# Patient Record
Sex: Male | Born: 1935 | Race: White | Hispanic: No | Marital: Single
Health system: Southern US, Community
[De-identification: ages and names within clinical notes are randomized; demographics above are authoritative.]

---

## 2004-10-04 ENCOUNTER — Ambulatory Visit: Payer: Self-pay | Admitting: Nephrology

## 2004-12-15 ENCOUNTER — Ambulatory Visit: Payer: Self-pay | Admitting: Internal Medicine

## 2006-09-22 ENCOUNTER — Other Ambulatory Visit: Payer: Self-pay

## 2006-09-22 ENCOUNTER — Emergency Department: Payer: Self-pay | Admitting: Emergency Medicine

## 2009-08-19 ENCOUNTER — Ambulatory Visit: Payer: Self-pay | Admitting: Unknown Physician Specialty

## 2010-06-16 ENCOUNTER — Ambulatory Visit: Payer: Self-pay | Admitting: Urology

## 2012-06-16 ENCOUNTER — Inpatient Hospital Stay: Payer: Self-pay | Admitting: Internal Medicine

## 2012-06-16 LAB — CBC
HCT: 33.1 % — ABNORMAL LOW
HGB: 10.4 g/dL — ABNORMAL LOW
MCH: 29.5 pg
MCHC: 31.6 g/dL — ABNORMAL LOW
MCV: 94 fL
Platelet: 251 x10 3/mm 3
RBC: 3.54 x10 6/mm 3 — ABNORMAL LOW
RDW: 14.1 %
WBC: 8.1 x10 3/mm 3

## 2012-06-16 LAB — PROTIME-INR: INR: 1.1

## 2012-06-16 LAB — BASIC METABOLIC PANEL WITH GFR
Anion Gap: 8
BUN: 22 mg/dL — ABNORMAL HIGH
Calcium, Total: 8.2 mg/dL — ABNORMAL LOW
Chloride: 104 mmol/L
Co2: 26 mmol/L
Creatinine: 1.49 mg/dL — ABNORMAL HIGH
EGFR (African American): 50 — ABNORMAL LOW
EGFR (Non-African Amer.): 43 — ABNORMAL LOW
Glucose: 138 mg/dL — ABNORMAL HIGH
Osmolality: 281
Potassium: 4.1 mmol/L
Sodium: 138 mmol/L

## 2012-06-16 LAB — HEPATIC FUNCTION PANEL A (ARMC)
Albumin: 2.5 g/dL — ABNORMAL LOW
Alkaline Phosphatase: 81 U/L
Bilirubin, Direct: 0.1 mg/dL
Bilirubin,Total: 0.3 mg/dL
SGOT(AST): 44 U/L — ABNORMAL HIGH
SGPT (ALT): 21 U/L
Total Protein: 6.1 g/dL — ABNORMAL LOW

## 2012-06-16 LAB — PRO B NATRIURETIC PEPTIDE: B-Type Natriuretic Peptide: 506 pg/mL — ABNORMAL HIGH (ref 0–450)

## 2012-06-16 LAB — CK TOTAL AND CKMB (NOT AT ARMC)
CK, Total: 510 U/L — ABNORMAL HIGH
CK-MB: 1.9 ng/mL

## 2012-06-16 LAB — TROPONIN I: Troponin-I: 0.06 ng/mL — ABNORMAL HIGH

## 2012-06-17 DIAGNOSIS — I369 Nonrheumatic tricuspid valve disorder, unspecified: Secondary | ICD-10-CM

## 2012-06-17 LAB — MAGNESIUM: Magnesium: 1.9 mg/dL

## 2012-06-17 LAB — CBC WITH DIFFERENTIAL/PLATELET
Basophil #: 0 10*3/uL (ref 0.0–0.1)
Basophil %: 0.4 %
Eosinophil %: 4 %
HGB: 11.4 g/dL — ABNORMAL LOW (ref 13.0–18.0)
Lymphocyte #: 1 10*3/uL (ref 1.0–3.6)
MCH: 30.4 pg (ref 26.0–34.0)
MCHC: 32.8 g/dL (ref 32.0–36.0)
Monocyte #: 0.5 x10 3/mm (ref 0.2–1.0)
Monocyte %: 6.3 %
Neutrophil #: 6.4 10*3/uL (ref 1.4–6.5)
Neutrophil %: 76.7 %
RDW: 14 % (ref 11.5–14.5)
WBC: 8.3 10*3/uL (ref 3.8–10.6)

## 2012-06-17 LAB — URINALYSIS, COMPLETE
Bacteria: NONE SEEN
Bilirubin,UR: NEGATIVE
Glucose,UR: NEGATIVE mg/dL (ref 0–75)
Ketone: NEGATIVE
Leukocyte Esterase: NEGATIVE
Nitrite: NEGATIVE
Ph: 5 (ref 4.5–8.0)
Protein: NEGATIVE
Specific Gravity: 1.005 (ref 1.003–1.030)
WBC UR: 1 /HPF (ref 0–5)

## 2012-06-17 LAB — COMPREHENSIVE METABOLIC PANEL
Albumin: 2.6 g/dL — ABNORMAL LOW (ref 3.4–5.0)
Alkaline Phosphatase: 87 U/L (ref 50–136)
Calcium, Total: 8 mg/dL — ABNORMAL LOW (ref 8.5–10.1)
Chloride: 105 mmol/L (ref 98–107)
Co2: 29 mmol/L (ref 21–32)
Creatinine: 1.47 mg/dL — ABNORMAL HIGH (ref 0.60–1.30)
EGFR (African American): 51 — ABNORMAL LOW
EGFR (Non-African Amer.): 44 — ABNORMAL LOW
Glucose: 106 mg/dL — ABNORMAL HIGH (ref 65–99)
Osmolality: 282 (ref 275–301)
Potassium: 3.7 mmol/L (ref 3.5–5.1)
SGOT(AST): 43 U/L — ABNORMAL HIGH (ref 15–37)
SGPT (ALT): 20 U/L (ref 12–78)
Sodium: 140 mmol/L (ref 136–145)

## 2012-06-17 LAB — LIPID PANEL
HDL Cholesterol: 44 mg/dL (ref 40–60)
Ldl Cholesterol, Calc: 78 mg/dL (ref 0–100)
Triglycerides: 122 mg/dL (ref 0–200)
VLDL Cholesterol, Calc: 24 mg/dL (ref 5–40)

## 2012-06-17 LAB — PROTIME-INR
INR: 1.2
Prothrombin Time: 16 secs — ABNORMAL HIGH (ref 11.5–14.7)

## 2012-06-17 LAB — CK TOTAL AND CKMB (NOT AT ARMC)
CK-MB: 2 ng/mL (ref 0.5–3.6)
CK-MB: 1.3 ng/mL (ref 0.5–3.6)

## 2012-06-17 LAB — PRO B NATRIURETIC PEPTIDE: B-Type Natriuretic Peptide: 669 pg/mL — ABNORMAL HIGH (ref 0–450)

## 2012-06-18 LAB — BASIC METABOLIC PANEL
BUN: 19 mg/dL — ABNORMAL HIGH (ref 7–18)
Calcium, Total: 8.1 mg/dL — ABNORMAL LOW (ref 8.5–10.1)
Co2: 31 mmol/L (ref 21–32)
Creatinine: 1.55 mg/dL — ABNORMAL HIGH (ref 0.60–1.30)
EGFR (African American): 48 — ABNORMAL LOW
Glucose: 107 mg/dL — ABNORMAL HIGH (ref 65–99)
Potassium: 3.9 mmol/L (ref 3.5–5.1)
Sodium: 138 mmol/L (ref 136–145)

## 2012-06-19 LAB — BASIC METABOLIC PANEL
Anion Gap: 6 — ABNORMAL LOW (ref 7–16)
BUN: 21 mg/dL — ABNORMAL HIGH (ref 7–18)
Calcium, Total: 8.1 mg/dL — ABNORMAL LOW (ref 8.5–10.1)
Chloride: 98 mmol/L (ref 98–107)
Co2: 31 mmol/L (ref 21–32)
EGFR (African American): 44 — ABNORMAL LOW
Sodium: 135 mmol/L — ABNORMAL LOW (ref 136–145)

## 2012-06-20 LAB — BASIC METABOLIC PANEL
Anion Gap: 6 — ABNORMAL LOW (ref 7–16)
Chloride: 101 mmol/L (ref 98–107)
Co2: 29 mmol/L (ref 21–32)
EGFR (African American): 49 — ABNORMAL LOW

## 2012-06-21 LAB — BASIC METABOLIC PANEL
Anion Gap: 8 (ref 7–16)
BUN: 21 mg/dL — ABNORMAL HIGH (ref 7–18)
Calcium, Total: 8.3 mg/dL — ABNORMAL LOW (ref 8.5–10.1)
Creatinine: 1.45 mg/dL — ABNORMAL HIGH (ref 0.60–1.30)
EGFR (African American): 52 — ABNORMAL LOW
EGFR (Non-African Amer.): 45 — ABNORMAL LOW
Osmolality: 272 (ref 275–301)
Potassium: 4.4 mmol/L (ref 3.5–5.1)

## 2012-06-22 LAB — CULTURE, BLOOD (SINGLE)

## 2012-06-22 LAB — HEMOGLOBIN: HGB: 10.2 g/dL — ABNORMAL LOW (ref 13.0–18.0)

## 2012-06-23 LAB — BASIC METABOLIC PANEL
BUN: 26 mg/dL — ABNORMAL HIGH (ref 7–18)
Calcium, Total: 8.6 mg/dL (ref 8.5–10.1)
Chloride: 102 mmol/L (ref 98–107)
Co2: 28 mmol/L (ref 21–32)
EGFR (African American): 53 — ABNORMAL LOW
Glucose: 113 mg/dL — ABNORMAL HIGH (ref 65–99)
Osmolality: 279 (ref 275–301)
Potassium: 4 mmol/L (ref 3.5–5.1)

## 2013-08-21 ENCOUNTER — Other Ambulatory Visit: Payer: Self-pay | Admitting: Family Medicine

## 2013-08-21 LAB — URINALYSIS, COMPLETE
Bilirubin,UR: NEGATIVE
Blood: NEGATIVE
GLUCOSE, UR: NEGATIVE mg/dL (ref 0–75)
Ketone: NEGATIVE
NITRITE: POSITIVE
PH: 6 (ref 4.5–8.0)
Protein: NEGATIVE
RBC,UR: 3 /HPF (ref 0–5)
Specific Gravity: 1.019 (ref 1.003–1.030)
Squamous Epithelial: <1
WBC UR: 249 /HPF (ref 0–5)

## 2013-08-26 LAB — URINE CULTURE

## 2013-10-01 ENCOUNTER — Inpatient Hospital Stay: Payer: Self-pay | Admitting: Family Medicine

## 2013-10-01 LAB — COMPREHENSIVE METABOLIC PANEL
ALBUMIN: 3.7 g/dL (ref 3.4–5.0)
ANION GAP: 6 — AB (ref 7–16)
Alkaline Phosphatase: 68 U/L
BUN: 25 mg/dL — AB (ref 7–18)
Bilirubin,Total: 0.5 mg/dL (ref 0.2–1.0)
Calcium, Total: 8.9 mg/dL (ref 8.5–10.1)
Chloride: 107 mmol/L (ref 98–107)
Co2: 28 mmol/L (ref 21–32)
Creatinine: 1.62 mg/dL — ABNORMAL HIGH (ref 0.60–1.30)
GFR CALC AF AMER: 45 — AB
GFR CALC NON AF AMER: 39 — AB
Glucose: 110 mg/dL — ABNORMAL HIGH (ref 65–99)
OSMOLALITY: 286 (ref 275–301)
Potassium: 3.8 mmol/L (ref 3.5–5.1)
SGOT(AST): 29 U/L (ref 15–37)
SGPT (ALT): 17 U/L (ref 12–78)
SODIUM: 141 mmol/L (ref 136–145)
Total Protein: 6.4 g/dL (ref 6.4–8.2)

## 2013-10-01 LAB — PROTIME-INR
INR: 1.9
INR: 1.3
PROTHROMBIN TIME: 21.3 s — AB (ref 11.5–14.7)
Prothrombin Time: 16.3 secs — ABNORMAL HIGH (ref 11.5–14.7)

## 2013-10-01 LAB — CBC WITH DIFFERENTIAL/PLATELET
Basophil #: 0.3 10*3/uL — ABNORMAL HIGH (ref 0.0–0.1)
Basophil %: 2 %
EOS PCT: 1.6 %
Eosinophil #: 0.2 10*3/uL (ref 0.0–0.7)
HCT: 43.3 % (ref 40.0–52.0)
HGB: 14.1 g/dL (ref 13.0–18.0)
LYMPHS ABS: 0.9 10*3/uL — AB (ref 1.0–3.6)
Lymphocyte %: 6.6 %
MCH: 31.5 pg (ref 26.0–34.0)
MCHC: 32.7 g/dL (ref 32.0–36.0)
MCV: 96 fL (ref 80–100)
Monocyte #: 0.6 x10 3/mm (ref 0.2–1.0)
Monocyte %: 4.4 %
NEUTROS ABS: 11.9 10*3/uL — AB (ref 1.4–6.5)
Neutrophil %: 85.4 %
Platelet: 157 10*3/uL (ref 150–440)
RBC: 4.5 10*6/uL (ref 4.40–5.90)
RDW: 14.3 % (ref 11.5–14.5)
WBC: 14 10*3/uL — AB (ref 3.8–10.6)

## 2013-10-01 LAB — URINALYSIS, COMPLETE
Bacteria: NONE SEEN
Bilirubin,UR: NEGATIVE
Glucose,UR: NEGATIVE mg/dL (ref 0–75)
Ketone: NEGATIVE
Nitrite: POSITIVE
Ph: 7 (ref 4.5–8.0)
Protein: NEGATIVE
RBC,UR: 340 /HPF (ref 0–5)
Specific Gravity: 1.012 (ref 1.003–1.030)
Squamous Epithelial: NONE SEEN
WBC UR: 63 /HPF (ref 0–5)

## 2013-10-01 LAB — APTT: ACTIVATED PTT: 32.2 s (ref 23.6–35.9)

## 2013-10-02 LAB — BASIC METABOLIC PANEL
ANION GAP: 7 (ref 7–16)
BUN: 20 mg/dL — AB (ref 7–18)
CO2: 28 mmol/L (ref 21–32)
CREATININE: 1.4 mg/dL — AB (ref 0.60–1.30)
Calcium, Total: 8.3 mg/dL — ABNORMAL LOW (ref 8.5–10.1)
Chloride: 105 mmol/L (ref 98–107)
EGFR (African American): 54 — ABNORMAL LOW
EGFR (Non-African Amer.): 46 — ABNORMAL LOW
Glucose: 112 mg/dL — ABNORMAL HIGH (ref 65–99)
OSMOLALITY: 283 (ref 275–301)
Potassium: 4.2 mmol/L (ref 3.5–5.1)
SODIUM: 140 mmol/L (ref 136–145)

## 2013-10-02 LAB — CBC WITH DIFFERENTIAL/PLATELET
BASOS ABS: 0 10*3/uL (ref 0.0–0.1)
BASOS PCT: 0.2 %
Eosinophil #: 0.2 10*3/uL (ref 0.0–0.7)
Eosinophil %: 2.2 %
HCT: 34.5 % — AB (ref 40.0–52.0)
HGB: 11.3 g/dL — ABNORMAL LOW (ref 13.0–18.0)
LYMPHS PCT: 12.2 %
Lymphocyte #: 1 10*3/uL (ref 1.0–3.6)
MCH: 31.4 pg (ref 26.0–34.0)
MCHC: 32.6 g/dL (ref 32.0–36.0)
MCV: 96 fL (ref 80–100)
Monocyte #: 0.7 x10 3/mm (ref 0.2–1.0)
Monocyte %: 8.7 %
NEUTROS ABS: 6.5 10*3/uL (ref 1.4–6.5)
Neutrophil %: 76.7 %
Platelet: 123 10*3/uL — ABNORMAL LOW (ref 150–440)
RBC: 3.58 10*6/uL — AB (ref 4.40–5.90)
RDW: 13.7 % (ref 11.5–14.5)
WBC: 8.5 10*3/uL (ref 3.8–10.6)

## 2013-10-03 LAB — BASIC METABOLIC PANEL
Anion Gap: 9 (ref 7–16)
BUN: 18 mg/dL (ref 7–18)
Calcium, Total: 7.7 mg/dL — ABNORMAL LOW (ref 8.5–10.1)
Chloride: 104 mmol/L (ref 98–107)
Co2: 25 mmol/L (ref 21–32)
Creatinine: 1.47 mg/dL — ABNORMAL HIGH (ref 0.60–1.30)
EGFR (African American): 51 — ABNORMAL LOW
GFR CALC NON AF AMER: 44 — AB
Glucose: 104 mg/dL — ABNORMAL HIGH (ref 65–99)
Osmolality: 278 (ref 275–301)
Potassium: 3.9 mmol/L (ref 3.5–5.1)
SODIUM: 138 mmol/L (ref 136–145)

## 2013-10-03 LAB — HEMOGLOBIN: HGB: 10 g/dL — ABNORMAL LOW (ref 13.0–18.0)

## 2013-10-04 LAB — URINE CULTURE

## 2013-12-07 ENCOUNTER — Ambulatory Visit: Payer: Self-pay | Admitting: Orthopedic Surgery

## 2013-12-07 LAB — BASIC METABOLIC PANEL
Anion Gap: 3 — ABNORMAL LOW (ref 7–16)
BUN: 30 mg/dL — AB (ref 7–18)
CHLORIDE: 104 mmol/L (ref 98–107)
CO2: 30 mmol/L (ref 21–32)
CREATININE: 1.6 mg/dL — AB (ref 0.60–1.30)
Calcium, Total: 9 mg/dL (ref 8.5–10.1)
GFR CALC AF AMER: 46 — AB
GFR CALC NON AF AMER: 40 — AB
Glucose: 104 mg/dL — ABNORMAL HIGH (ref 65–99)
OSMOLALITY: 280 (ref 275–301)
POTASSIUM: 4.1 mmol/L (ref 3.5–5.1)
SODIUM: 137 mmol/L (ref 136–145)

## 2013-12-07 LAB — PROTIME-INR
INR: 1.4
Prothrombin Time: 17.3 secs — ABNORMAL HIGH (ref 11.5–14.7)

## 2013-12-07 LAB — URINALYSIS, COMPLETE
BILIRUBIN, UR: NEGATIVE
BLOOD: NEGATIVE
GLUCOSE, UR: NEGATIVE mg/dL (ref 0–75)
KETONE: NEGATIVE
Nitrite: NEGATIVE
Ph: 5 (ref 4.5–8.0)
Protein: NEGATIVE
RBC,UR: 3 /HPF (ref 0–5)
SPECIFIC GRAVITY: 1.011 (ref 1.003–1.030)
Squamous Epithelial: <1
WBC UR: 50 /HPF (ref 0–5)

## 2013-12-07 LAB — CBC
HCT: 38.2 % — AB (ref 40.0–52.0)
HGB: 12.1 g/dL — ABNORMAL LOW (ref 13.0–18.0)
MCH: 30.8 pg (ref 26.0–34.0)
MCHC: 31.7 g/dL — AB (ref 32.0–36.0)
MCV: 97 fL (ref 80–100)
Platelet: 184 10*3/uL (ref 150–440)
RBC: 3.94 10*6/uL — ABNORMAL LOW (ref 4.40–5.90)
RDW: 13.7 % (ref 11.5–14.5)
WBC: 5.4 10*3/uL (ref 3.8–10.6)

## 2013-12-07 LAB — SEDIMENTATION RATE: Erythrocyte Sed Rate: 21 mm/hr — ABNORMAL HIGH (ref 0–20)

## 2013-12-07 LAB — MRSA PCR SCREENING

## 2013-12-07 LAB — APTT: Activated PTT: 30.9 secs (ref 23.6–35.9)

## 2013-12-10 ENCOUNTER — Inpatient Hospital Stay: Payer: Self-pay | Admitting: Internal Medicine

## 2013-12-11 LAB — BASIC METABOLIC PANEL
Anion Gap: 6 — ABNORMAL LOW (ref 7–16)
BUN: 21 mg/dL — ABNORMAL HIGH (ref 7–18)
Calcium, Total: 8 mg/dL — ABNORMAL LOW (ref 8.5–10.1)
Chloride: 108 mmol/L — ABNORMAL HIGH (ref 98–107)
Co2: 27 mmol/L (ref 21–32)
Creatinine: 1.41 mg/dL — ABNORMAL HIGH (ref 0.60–1.30)
EGFR (African American): 53 — ABNORMAL LOW
EGFR (Non-African Amer.): 46 — ABNORMAL LOW
GLUCOSE: 118 mg/dL — AB (ref 65–99)
Osmolality: 285 (ref 275–301)
Potassium: 4.1 mmol/L (ref 3.5–5.1)
SODIUM: 141 mmol/L (ref 136–145)

## 2013-12-11 LAB — HEMOGLOBIN: HGB: 9.9 g/dL — ABNORMAL LOW (ref 13.0–18.0)

## 2013-12-11 LAB — PLATELET COUNT: PLATELETS: 132 10*3/uL — AB (ref 150–440)

## 2013-12-12 LAB — CBC WITH DIFFERENTIAL/PLATELET
Basophil #: 0 10*3/uL (ref 0.0–0.1)
Basophil %: 0.3 %
EOS ABS: 0 10*3/uL (ref 0.0–0.7)
EOS PCT: 0.7 %
HCT: 26 % — ABNORMAL LOW (ref 40.0–52.0)
HGB: 8.4 g/dL — AB (ref 13.0–18.0)
LYMPHS ABS: 1 10*3/uL (ref 1.0–3.6)
LYMPHS PCT: 17.1 %
MCH: 31.1 pg (ref 26.0–34.0)
MCHC: 32.1 g/dL (ref 32.0–36.0)
MCV: 97 fL (ref 80–100)
MONO ABS: 0.6 x10 3/mm (ref 0.2–1.0)
Monocyte %: 10.2 %
NEUTROS PCT: 71.7 %
Neutrophil #: 4.1 10*3/uL (ref 1.4–6.5)
Platelet: 121 10*3/uL — ABNORMAL LOW (ref 150–440)
RBC: 2.69 10*6/uL — AB (ref 4.40–5.90)
RDW: 13.7 % (ref 11.5–14.5)
WBC: 5.7 10*3/uL (ref 3.8–10.6)

## 2013-12-12 LAB — BASIC METABOLIC PANEL
Anion Gap: 7 (ref 7–16)
BUN: 17 mg/dL (ref 7–18)
CALCIUM: 7.7 mg/dL — AB (ref 8.5–10.1)
CREATININE: 1.33 mg/dL — AB (ref 0.60–1.30)
Chloride: 108 mmol/L — ABNORMAL HIGH (ref 98–107)
Co2: 25 mmol/L (ref 21–32)
EGFR (African American): 57 — ABNORMAL LOW
EGFR (Non-African Amer.): 49 — ABNORMAL LOW
GLUCOSE: 98 mg/dL (ref 65–99)
OSMOLALITY: 281 (ref 275–301)
Potassium: 3.9 mmol/L (ref 3.5–5.1)
Sodium: 140 mmol/L (ref 136–145)

## 2013-12-13 LAB — CBC WITH DIFFERENTIAL/PLATELET
Basophil #: 0 10*3/uL (ref 0.0–0.1)
Basophil %: 0.2 %
Eosinophil #: 0.1 10*3/uL (ref 0.0–0.7)
Eosinophil %: 2.1 %
HCT: 25.4 % — AB (ref 40.0–52.0)
HGB: 8.4 g/dL — ABNORMAL LOW (ref 13.0–18.0)
LYMPHS ABS: 1 10*3/uL (ref 1.0–3.6)
Lymphocyte %: 18.9 %
MCH: 31.7 pg (ref 26.0–34.0)
MCHC: 33.1 g/dL (ref 32.0–36.0)
MCV: 96 fL (ref 80–100)
MONO ABS: 0.5 x10 3/mm (ref 0.2–1.0)
MONOS PCT: 9.9 %
NEUTROS ABS: 3.5 10*3/uL (ref 1.4–6.5)
Neutrophil %: 68.9 %
Platelet: 123 10*3/uL — ABNORMAL LOW (ref 150–440)
RBC: 2.66 10*6/uL — ABNORMAL LOW (ref 4.40–5.90)
RDW: 13.6 % (ref 11.5–14.5)
WBC: 5.1 10*3/uL (ref 3.8–10.6)

## 2013-12-13 LAB — IRON AND TIBC
IRON: 16 ug/dL — AB (ref 65–175)
Iron Bind.Cap.(Total): 133 ug/dL — ABNORMAL LOW (ref 250–450)
Iron Saturation: 12 %
Unbound Iron-Bind.Cap.: 117 ug/dL

## 2013-12-13 LAB — FERRITIN: Ferritin (ARMC): 339 ng/mL (ref 8–388)

## 2013-12-14 LAB — PATHOLOGY REPORT

## 2014-03-23 ENCOUNTER — Ambulatory Visit: Payer: Self-pay | Admitting: Ophthalmology

## 2014-04-06 ENCOUNTER — Ambulatory Visit: Payer: Self-pay | Admitting: Ophthalmology

## 2014-06-24 ENCOUNTER — Ambulatory Visit: Payer: Self-pay | Admitting: Ophthalmology

## 2014-06-29 ENCOUNTER — Ambulatory Visit: Payer: Self-pay | Admitting: Ophthalmology

## 2014-09-03 NOTE — Discharge Summary (Signed)
PATIENT NAME:  John [name], John [name] MR#:  [id number] DATE OF BIRTH:  1929-10-16  DATE OF ADMISSION:  06/16/2012 DATE OF DISCHARGE:  06/23/2012  PRIMARY CARE PHYSICIAN: Barry BrunnerGlenn Willett, MD  HISTORY OF PRESENT ILLNESS: The patient is an 81 year old gentleman who has history of paroxysmal atrial fibrillation, hypertension and benign prostatic hypertrophy who apparently had cough that started about a month ago and had been coughing a lot during the whole day and night, not even sleeping due to this. He used to live by himself, but since he is getting sicker he just moved into his niece's house. The patient said that his cough is overall dry, but occasionally he has clear small amount of phlegm which again is clear, not yellow, not red. Last Thursday he went to visit his doctor and he was diagnosed with possible pneumonia, was given Levaquin and some cough medication, but a week before presentation to the hospital he continued feeling weak, not getting any better, even if he was taking the treatment, and so he came to the Emergency Room. He also had an episode of fever. He felt hot and sweaty, took Tylenol, and there was only one episode of fever.  His right foot was swollen a few days before presentation, bilateral leg edema, but there is significant difference between the right being double the size of the left. The patient was hypoxemic on presentation to ER, low 90s to high 80s, and was started on oxygen and broad-spectrum antibiotic coverage with treatment of pulmonary edema.  HOSPITAL COURSE AND STAY:  1. Acute respiratory failure. The patient had mild to moderate respiratory failure requiring oxygen via nasal cannula and significant respiratory distress with exertion. He was treated for possible 2 etiologies. One was congestive heart failure and the second was pneumonia and he felt significantly better after treatment of 6 to 7 days in hospital. There was also concern about pulmonary embolus as he had leg swelling  and he had shortness of breath on presentation. The patient was taking Xarelto for his atrial fibrillation.  DVT study was done and no evidence of DVT in bilateral lower extremities. V/Q scan was done and that was quite limited due to severe underlying lung disease. Finding most consistent with intermediate, indeterminate due to underlying lung abnormalities. CT of the chest could not be done as the patient had acute on chronic renal failure, but due to negative DVT and he was on Xarelto we decided not to do any further workup or testing for this thing and we just focused on treating pneumonia and CHF. For pneumonia, as mentioned above, he was on Levaquin as outpatient, but he did not have any improvement so here he was started on Zosyn as there was some questionable underlying lung disease and added azithromycin to cover atypical bacteria. He was treated with IV antibiotics for 7 days in hospital and he had significant improvement in symptoms, very minimal cough, no sputum and shortness of breath resolved so he is being discharged without any antibiotics.  2. Congestive heart failure. He was initially started on Lasix IV and he had some worsening of his renal failure. We held the Lasix but then we restarted as the renal function improved a little bit. His echocardiogram was done in the hospital which showed left ventricular systolic function is low normal, ejection fraction 50 to 55, and transmitral septal Doppler flow pattern is suggestive of impaired LV relaxation, mild concentric left ventricular hypertrophy, right ventricular systolic function is normal, left atrium is mildly dilated,  mild tricuspid regurgitation and right ventricular systolic pressure is elevated at 40 to 50. So we continued with low dose Lasix and he had significant improvement after that in his symptoms. He did not need any oxygen supplementation later on while in the hospital. TSH level was checked and that was 8 so he was started on  levothyroxine and we are discharging with the same.  3. Troponin elevation. Was possibly due to demand ischemia and it remained stable on followup so we attributed it to infection and CHF.  4. Acute on chronic renal failure. We held the Lasix for 1 or 2 days and then it started improving so we just continued low dose Lasix and it remained stable. He needs to follow up about this as outpatient after discharge. On discharge creatinine is 1.4.  5. Hypertension. Remained controlled in the hospital.  6. Paroxysmal afib. He was on Xarelto as outpatient. We continued the same while in the hospital and discharging on the same.  7. Benign prostatic hypertrophy. Continued on tamsulosin in the hospital. We are discharging on the same.   IMPORTANT LAB RESULTS IN THE HOSPITAL: On admission BNP was 506 which went up to 669 later on. His creatinine on admission was 1.49 and went to highest up to 1.66 and on discharge is 1.42.   Troponin level was 0.06, which remained stable on followup, 3 times.   Thyroid stimulating hormone 8.33.   Hemoglobin 10.4, which was stable throughout the hospital stay.  Urinalysis was negative on admission.  V/Q scan, Doppler study of the lower limbs and echocardiogram as mentioned above.   CONDITION ON DISCHARGE: Stable.   FINAL DIAGNOSES ON DISCHARGE: 1. Pneumonia. 2. Acute diastolic heart failure. 3. Acute on chronic renal failure. 4. Hypertension. 5. Atrial fibrillation. 6. Hypothyroidism. 7. Benign prostatic hypertrophy.   CONDITION ON DISCHARGE: Stable.   CODE STATUS ON DISCHARGE: DO NOT RESUSCITATE.   MEDICATIONS ON DISCHARGE: 1. Tamsulosin 0.4 mg oral capsule once a day.  2. Mucinex oral tablet every 4 hours as needed for cold symptoms. 3. Benzonatate 100 mg oral capsule every 8 hours as needed for cough. 4. Xarelto 15 mg oral tablet once a day.  5. Diltiazem 180 mg 24-hour capsule once a day.  6. Aspirin 81 mg delayed-release tablet once a day.  7. Lasix  20 mg oral tablet once a day.  8. Colace 100 mg oral capsule 2 times a day as needed for constipation.  9. Pantoprazole 40 mg oral delayed-release once a day.  10. Levothyroxine 50 mcg oral tablet once a day.   DIET CONSISTENT ON DISCHARGE: Regular.  DIET CONSISTENCY ON DISCHARGE: Low sodium, low fat, low cholesterol diet.  TIME FRAME TO FOLLOWUP: Within 1 to 2 weeks with primary care physician, Dr. Barry Brunner.         LABS TO FOLLOW AFTER DISCHARGE: Renal function within next month and TSH level within next month.   TOTAL TIME SPENT ON DISCHARGE: 45 minutes.  ____________________________ Hope Pigeon Elisabeth Pigeon, MD vgv:sb D: 06/23/2012 12:43:29 ET T: 06/23/2012 13:36:58 ET JOB#: [id number]  cc: Hope Pigeon. Elisabeth Pigeon, MD, <Dictator> Jorje Guild. Beckey Downing, MD Altamese Dilling MD ELECTRONICALLY SIGNED 07/04/2012 18:05

## 2014-09-03 NOTE — H&P (Signed)
PATIENT NAME:  John [name], John [name] MR#:  [id number] DATE OF BIRTH:  26-Nov-1929  DATE OF ADMISSION:  06/16/2012  PRIMARY CARE PHYSICIAN: Barry BrunnerGlenn Willett, MD  REFERRING PHYSICIAN:  Ella BodoAnne Norman, MD  CHIEF COMPLAINT:   Cough and increased shortness of breath, edema.   HISTORY OF PRESENT ILLNESS: The patient is a very nice 81 year old gentleman who has a history of paroxysmal atrial fibrillation, hypertension and BPH.  The patient apparently has a cough that started about a month ago.  He has been coughing a lot during the whole day and night and not even sleeping due to this. He used to live by himself, but since he is getting sicker, he just moved into his niece's house. The patient states that his cough is overall dry but occasionally has some clear small amounts of phlegm, which again is clear., not yellow, not red. Last Thursday he went to visit his doctor and he was diagnosed with possible pneumonia.  He was given Levaquin and some cough medications.  During the past week, the patient has been feeling really weak, not getting any better and for this, the patient is being brought to the ER.  Apparently he had a fever last Saturday.  He felt hot and sweaty. He had some Tylenol but he has not had any since then. His right foot has been swollen since Saturday. He has bilateral leg edema but there is a significant difference between the right being double the size of the left.   The patient is a poor historian, his niece is there to help out, answering some questions. The patient came in and was evaluated in the ER, his chest x-ray was not definitive but it looks more like a pulmonary edema than a pneumonia, although he has been started on antibiotics. The patient was hypoxemic with oxygen level down to the low 90s to high 80s. He was put on oxygen. He has been tachycardic with a pulse around 97t, but he has been afebrile.   REVIEW OF SYSTEMS:  A 12-system review of systems is done. CONSTITUTIONAL: The patient  has been fatigued and weak. No significant weight loss or weight gain that he can tell. He does not check his weight.  He had a fever last Saturday as mentioned above that was not quantified.  EYES: No blurry vision, double vision. No glaucoma. No cataracts.  ENT: No tinnitus. No difficulty swallowing. No nasal discharge. No upper respiratory or infection symptoms like sneezing or rhinorrhea.   RESPIRATORY: Positive cough. Positive dyspnea with exertion. The patient states that he has not been wheezing or feel like wheezing, no painful respirations. No prior history of pneumonia.  CARDIOVASCULAR: No chest pain. No orthopnea. No palpitations. Positive arrhythmias. He has paroxysmal atrial fibrillation, right now is in normal sinus rhythm. No syncope, no varicose veins. Positive edema. He has some edema of the left lower extremity and significant edema and a large amount of fluids on the right lower extremity.  GASTROINTESTINAL: No nausea, vomiting, diarrhea. No abdominal pain. No constipation. No hematemesis. No melena.  GENITOURINARY: No dysuria, hematuria but does have increased urinary frequency due to BPH. No prostatitis.  ENDOCRINOLOGY: No polyuria, polydipsia or polyphagia. No cold or heat intolerance.  HEMATOLOGIC/LYMPHATIC: No anemia, easy bruising or swollen glands.  MUSCULOSKELETAL: No significant neck pain, back pain, shoulder pain. No gout.  NEUROLOGIC: No numbness. No weakness. No ataxia. No dementia.  Although this has not been diagnosed, the patient had some mild memory loss. No CVA. No TIA.  PSYCHIATRIC: No agitation. No depression.   PAST MEDICAL HISTORY: 1.  Paroxysmal air fibrillation.  2.  Hypertension.  3.  Chronic anticoagulation with Xarelto.  4.  Benign prostatic hypertrophy.   ALLERGIES: No known drug allergies.   PAST SURGICAL HISTORY:  No previous surgical procedures.   FAMILY HISTORY: Positive for MI in his father, CHF in his mother.  He denies any type of cancer,  diabetes or hypertension run in the family.   SOCIAL HISTORY: The patient lives by himself. He is retired. He used to smoke.  He quit in 1969. He smoked for 15 years when he was in the [employer]. He smoked 1 pack a day. He denies any alcohol abuse.  As [name] mentioned above, he used to live by himself and taking care of all his bills and needs although he is no longer able to take care of that for what he agrees with findings some placement.    CURRENT MEDICATIONS:  Xarelto 50 mg once daily.  Flomax 0.4 mg once daily.  Mucinex  p.r.n. cough.  Levofloxacin 500 mg once daily.  Cartia 180 mg once a day. Benzoate 800 mg every 8 hours.   PHYSICAL EXAMINATION: VITAL SIGNS: Blood pressure 125/67, pulse 97, respiratory rate 22, temperature 98.5, oxygen saturation right now is 95% on [name] believe 2 to 3 liters of oxygen.  GENERAL: The patient is alert, oriented x 3. He is cooperative. He is hemodynamically stable. No acute distress. He looks chronically debilitated though. ENT: Pupils are equal, round and reactive. Extraocular movements are intact. Mucosa moist. Anicteric sclerae. Pink conjunctivae. No oral lesions. No oropharyngeal exudates.  NECK: Supple. No JVD. No thyromegaly. No adenopathy.  Again, [name] cannot appreciate any JVD. No carotid bruits. No rigidity of the neck.  CARDIOVASCULAR: At this moment is regular rate and rhythm. No S3 or S4, no murmurs, rubs or gallops. No displacement of PMI. No tenderness to palpation of anterior chest wall.  LUNGS: The patient has crackles, diffuse in both bases, no tubular sounds.  No signs of consolidation. No dullness to percussion. There are some rales in the left base more than right. No use of accessory muscles at this moment.  Air expansion seems to be poor.  ABDOMEN: Soft, nontender, nondistended, no hepatosplenomegaly, no masses. Bowel sounds are positive.  GENITALIA: Negative for external lesions.  EXTREMITIES: No edema. No cyanosis or clubbing of the upper  extremities.  The lower extremities have asymmetric edema with right lower extremity having +3 pitting edema at the level of the ankle and pretibial and dorsalis areas. His left lower extremity has some pitting edema +1 to +2 at the level of the ankle. There is no Homans positive at this moment.  The capillary refill is around 4 seconds. Sensation is maintained distally. No ulcers.  SKIN: Without any rashes or petechiae. He has multiple capillary lesions that are chronic on the back and face. PSYCHIATRIC: Mood is normal without any signs of depression or anxiety.  NEUROLOGIC: Cranial nerves II through XII are intact. No focal findings. Strength is 5/5 in all 4 extremities.  MUSCULOSKELETAL: No significant joint deformity, no joint effusion or swelling.   LABORATORY AND DIAGNOSTIC DATA:  Glucose 138, BNP is 506, BUN 22, creatinine is 1.49, potassium 4.1, sodium is 138, calcium 8.2, total CK is 510. Troponin is 0.06.  White count is 8.1. Hemoglobin is 10.4, platelets 251, INR is 1.1, PTT is 36, prothrombin is 14.9. Chest x-ray as mentioned above, bilateral pulmonary edema, cannot  exclude the possibility of infectious process.   EKG: Normal sinus rhythm with without ST depression or elevation.   ASSESSMENT AND PLAN: An 81 year old gentleman with history of paroxysmal atrial fibrillation, hypertension, anticoagulation with Xarelto and benign prostatic hypertrophy who comes with cough and shortness of breath for over a month.   1.  Respiratory failure. The patient has mild to moderate respiratory failure requiring oxygen via nasal cannula. There is no significant respiratory distress at this moment, the patient is on portable and is stable. Etiology of this could be:  (A)  Congestive heart failure.  (B)  Pneumonia.  (C)..  Possible pulmonary embolus.   At this moment is not certain what the reason is but [name] had to point more to a flash pulmonary edema due to possible pulmonary embolus.   [name] am unable to get a  V-Q scan as per the radiology department who informed me [name] cannot obtain a CT scan of the chest with intravenous contrast due to the fact that the patient had acute kidney injury, although [name] am not quite sure if this acute or chronic.  The family cannot tell me what his creatinine is at baseline. We are going to await for the V-Q scan in the morning but for now, [name] am going to do an ultrasound of the lower extremities since the patient has asymmetric edema. We are going to anticoagulate him with Lovenox. The patient should be anticoagulated anyway since he had the atrial fibrillation and he was taking Xarelto.  He might not be taking his Xarelto as his INR is o1.  Since the patient has been already started on Zosyn [name] am going to continue Zosyn. He has received 1 dose of vancomycin, although I do not think this is necessary. His white count is normal. He has not been hospitalized and he does not have any significant risk factors for methicillin-resistant staph aureus, so [name] am not going to continue the vancomycin and [name] am going to add azithromycin to cover her atypical bacteria.  2.  As far possible congestive heart failure, we are going to do an echocardiogram and follow-up the results.  3.  As far as his atrial fibrillation, the patient is taking [place]. He is rate controlled. 4.  As far as his high blood pressure, the patient is hemodynamically stable, and we are just going to continue the Cartia for now and hold it if his blood pressure drops.  5.  Benign prostatic hypertrophy. Continue with the tamsulosin. 6.  Elevated creatinine.  I do not know what his baseline. We are going to request records from Dr. Beckey Downing to see if he has any recent creatinine but overall [name] am going to start him on Lasix and follow up his creatinine. He is going to be on a small dose of 20 three times a day intravenously. 7.  Elevated troponin, likely due to stress or demand ischemia. We are going to monitor his cardiac enzymes, give  him aspirin 81 mg a day for now and anticoagulate him with Lovenox.   CODE STATUS:  The patient is a DO NOT RESUSCITATE.   TIME SPENT: [name] spent about 50 minutes with this admission.   ____________________________ Felipa Furnace, MD rsg:ct D: 06/16/2012 18:07:53 ET T: 06/16/2012 19:07:32 ET JOB#: [id number]  cc: Felipa Furnace, MD, <Dictator> Jorje Guild. Beckey Downing, MD Regan Rakers Juanda Chance MD ELECTRONICALLY SIGNED 06/24/2012 13:48

## 2014-09-04 NOTE — Consult Note (Signed)
PATIENT NAME:  John [name], John [name] MR#:  [id number] DATE OF BIRTH:  Jun 16, 1929  DATE OF CONSULTATION:  12/10/2013  REFERRING PHYSICIAN:   CONSULTING PHYSICIAN:  Hendy Brindle P. Juliene PinaMody, MD  PRIMARY CARE PHYSICIAN: Dr. Beckey DowningWillett.  REFERRING PHYSICIAN: Dr. Rosita KeaMenz.   REASON FOR CONSULTATION: Altered mental status.  HISTORY OF PRESENT ILLNESS: An 81 year old male who presented today for hardware removal of his left hip and total left hip, who underwent spinal anesthesia and had a Versed propofol drip and some fentanyl early this morning. Postoperatively, the patient is very minimally responsive. Hospitalists were called for evaluation for his encephalopathy.   REVIEW OF SYSTEMS: Unable to obtain as the patient is not responsive.   PAST MEDICAL HISTORY: As per medical chart:  1. Paroxysmal atrial fibrillation. The patient was taken off his Xarelto on July 27 for this procedure.  2. Hypertension.  3. BPH.   SOCIAL HISTORY: The patient is a resident of a skilled nursing facility. Remote history of tobacco use. No alcohol use as per the medical chart.   FAMILY HISTORY: As per medical chart: CAD.   ALLERGIES: No known drug allergies.   MEDICATIONS:  1. Tylenol 325 two tablets q.4 h. p.r.n. pain or temperature greater than 100.4.  2. Acetaminophen hydrocodone 325/5 q.4-6 h. p.r.n.  3. Bisacodyl suppository p.r.n.  4. Diltiazem 180 mg daily.  5. Docusate 100 mg b.[name].d.  6. Lasix 20 mg daily.  7. Iron polysaccharide 1 tablet daily. 8. Synthroid 80 mcg daily.  9. Magnesium hydroxide 8% oral suspension 30 mL q.8 h. p.r.n. for constipation.  10. Pantoprazole 40 mg daily.  11. Polyethylene glycol 17 grams daily.  12. Tamsulosin 0.4 mg daily.  13. Vitamin D3 one tablet daily  14. Xarelto 15 mg in the evening.   PHYSICAL EXAMINATION:  VITAL SIGNS: Temperature is afebrile. His blood pressure is 96/53, heart rate of 67, 95% on room air. Respiratory rate is 12.  GENERAL: The patient is not responsive, does not  appear to be in acute distress.  HEENT: Pupils are 2 mm, very sluggish to light.  OROPHARYNX: Clear. Mucous membranes are moist.  NECK: Supple. No JVD or carotid bruit  CARDIOVASCULAR: Irregularly irregular. No murmurs, gallops, or rubs. PMI is not displaced.  LUNGS: Clear to auscultation without crackles, rhonchi, wheezing. Normal to percussion.  ABDOMEN: Bowel sounds are positive. Nontender, nondistended. No hepatosplenomegaly is noted.  EXTREMITIES: No clubbing, cyanosis or edema.  NEUROLOGIC: Babinski sign is downgoing. Reflexes are 1+ bilaterally and symmetric. His pupils are very sluggish. He is basically unresponsive. SKIN: No rash or lesions. He has a bandage on his left hip.   LABORATORY DATA: There are no laboratories from this morning.   ASSESSMENT AND PLAN: An 81 year old male who has postoperative encephalopathy.   1. Postoperative encephalopathy. The patient received spinal anesthesia with Propofol drip, a 1-time dose of fentanyl, and 2 mg of Versed. He is 3 to 4 hours postoperative and still unresponsive. He did receive 2 doses of flumazenil without any response. The patient does not have any focal neurological deficits. [name] suspect this could just be from prolonged sedation; however, with a history of atrial fibrillation off of Xarelto, we also can be concerned of a stroke. [name] have ordered a CT of the head to evaluate, and an EEG. Neurology has been consulted by the referring physician, Dr. Rosita KeaMenz.  2. History of atrial fibrillation. The patient was off his Xarelto for this planned procedure. Currently he is not responsive, so we cannot provide any  of his home medications. We will monitor closely on telemetry in the stepdown unit.  3. History of hypothyroidism. The patient is on Synthroid 88 mcg daily, which he may resume once he is arousable.  4. Postoperative day number 0 for left total hip as per orthopedics. Deep vein thrombosis prophylaxis per orthopedics. 5. Benign prostatic  hypertrophy. We will continue tamsulosin once the patient is arousable.  6. Chronic kidney disease. We will monitor creatinine in the a.m.   CODE STATUS: It appears the patient is a DNR status from his paperwork. We will continue these wishes.   CRITICAL CARE TIME SPENT: 55 minutes.   The plan of care was discussed with Dr. Neomia Glass nurse.    ____________________________ Janyth Contes. Juliene Pina, MD spm:jr D: 12/10/2013 15:08:16 ET T: 12/10/2013 15:55:31 ET JOB#: [id number]  cc: Richmond Coldren P. Juliene Pina, MD, <Dictator> [name], MD Jorje Guild Beckey Downing, MD Janyth Contes Modell Fendrick MD ELECTRONICALLY SIGNED 12/10/2013 17:02

## 2014-09-04 NOTE — Consult Note (Signed)
Brief Consult Note: Diagnosis: left intertrochanteric hip fracture.   Patient was seen by consultant.   Recommend to proceed with surgery or procedure.   Orders entered.   Comments: rec ORIF later today.  Electronic Signatures: Leitha SchullerMenz, Irys Nigh J (MD)  (Signed 21-May-15 10:48)  Authored: Brief Consult Note   Last Updated: 21-May-15 10:48 by Leitha SchullerMenz, Jaylynn Siefert J (MD)

## 2014-09-04 NOTE — Discharge Summary (Signed)
PATIENT NAME:  John [name], John [name] MR#:  [id number] DATE OF BIRTH:  [birth year]  DATE OF ADMISSION:  12/10/2013 DATE OF DISCHARGE:  12/14/2013  DISCHARGE DIAGNOSES:  1.  Postoperative encephalopathy.  2.  Anemia.  3.  Acute renal failure on chronic kidney disease.  4.  History of atrial fibrillation.   PROCEDURE:  Left total hip revision.   CONDITION: Stable.   CODE STATUS: Full code.   HOME MEDICATIONS: Please refer to the medication reconciliation list.   DIET: Low-sodium diet.   ACTIVITY: As tolerated.   FOLLOWUP CARE: Follow up with Dr. Rosita KeaMenz, orthopedic surgeon, and with PCP within 1-2 weeks.   REASON FOR ADMISSION: Altered mental status.   HOSPITAL COURSE: The patient is an 81 year old Caucasian male with a history of paroxysmal atrial fibrillation, hypertension, BPH, who underwent left hip revision surgery. After surgery the patient was very minimally responsive.  Hospitalist service was called for evaluation for encephalopathy. For detailed history and physical examination, please refer to the admission note dictated by Dr. Juliene PinaMody.    LABORATORY DATA ON ADMISSION:  BUN 21, creatinine 1.41, hemoglobin 9.9, platelets 132,000.    ASSESSMENT AND PLAN:  1.  Postoperative encephalopathy, which is possibly due to anesthesia. The patient's CAT scan of the head is negative. Neurologist evaluated the patient and suggested the patient's mental status is back to his baseline.  The patient's mental status has improved.   2.  Postoperative left total hip revision. After the surgery the patient underwent physical therapy evaluation.  The needs subacute rehabilitation placement.  3.  Anemia. The patient's hemoglobin decreased to 8.4. We did an anemia workup; anemia possibly due to acute blood loss from hip surgery. Hemoglobin has been stable. The patient has no active bleeding.  Anemia workup showed iron deficiency.  The patient is taking iron supplement.  4.  Acute renal failure, improved after  surgery.  5.  History of atrial fibrillation. The patient's xarelto was resumed after surgery.   The patient has no complaints. He is clinically stable and will be discharged to subacute rehabilitation today. [name] discussed the patient's discharge plan with the patient, nurse, case manager, and Child psychotherapistsocial worker.   TIME SPENT: About 37 minutes.    ____________________________ Shaune PollackQing Desirey Keahey, MD qc:lt D: 12/14/2013 10:18:12 ET T: 12/14/2013 10:44:24 ET JOB#: [id number]  cc: Shaune PollackQing Nichola Cieslinski, MD, <Dictator> Shaune PollackQING Venetia Prewitt MD ELECTRONICALLY SIGNED 12/14/2013 11:48

## 2014-09-04 NOTE — Op Note (Signed)
PATIENT NAME:  John HawsVANS, [name] MR#:  [id number] DATE OF BIRTH:  1930-04-12  DATE OF PROCEDURE:  04/06/2014  PREOPERATIVE DIAGNOSIS:  Nuclear sclerotic cataract of the right eye.   POSTOPERATIVE DIAGNOSIS:  Nuclear sclerotic cataract of the right eye.   OPERATIVE PROCEDURE:  Cataract extraction by phacoemulsification with implant of intraocular lens to right eye.   SURGEON:  Galen ManilaWilliam Miyoko Hashimi, MD.   ANESTHESIA:  1. Managed anesthesia care.  2. Topical tetracaine drops followed by 2% Xylocaine jelly applied in the preoperative holding area.   COMPLICATIONS:  None.   TECHNIQUE:   Stop and chop  DESCRIPTION OF PROCEDURE:  The patient was examined and consented in the preoperative holding area where the aforementioned topical anesthesia was applied to the right eye and then brought back to the operating room where the right eye was prepped and draped in the usual sterile ophthalmic fashion and a lid speculum was placed. A paracentesis was created with the side port blade and the anterior chamber was filled with viscoelastic. A near clear corneal incision was performed with the steel keratome. A continuous curvilinear capsulorrhexis was performed with a cystotome followed by the capsulorrhexis forceps. Hydrodissection and hydrodelineation were carried out with BSS on a blunt cannula. The lens was removed in a stop and chop technique and the remaining cortical material was removed with the irrigation-aspiration handpiece. The capsular bag was inflated with viscoelastic and the Tecnis ZCB00 19.5 diopter lens, serial number [id number] was placed in the capsular bag without complication. The remaining viscoelastic was removed from the eye with the irrigation-aspiration handpiece. The wounds were hydrated. The anterior chamber was flushed with Miostat and the eye was inflated to physiologic pressure. 0.1 mL of cefuroxime concentration 10 mg/mL was placed in the anterior chamber. The wounds were found to be  water tight. The eye was dressed with Vigamox. The patient was given protective glasses to wear throughout the day and a shield with which to sleep tonight. The patient was also given drops with which to begin a drop regimen today and will follow-up with me in one day.    ____________________________ Jerilee FieldWilliam L. Minoru Chap, MD wlp:bm D: 04/06/2014 17:29:44 ET T: 04/06/2014 22:58:00 ET JOB#: [id number]  cc: Farin Buhman L. Braylyn Eye, MD, <Dictator> Jerilee FieldWILLIAM L Kara Mierzejewski MD ELECTRONICALLY SIGNED 04/07/2014 15:25

## 2014-09-04 NOTE — Op Note (Signed)
PATIENT NAME:  John [name], John [name] MR#:  [id number] DATE OF BIRTH:  18-Mar-1930  DATE OF PROCEDURE:  12/10/2013  PREOPERATIVE DIAGNOSIS: Failure fixation, and osteoarthritis left hip.   POSTOPERATIVE DIAGNOSIS:  Failure fixation, and osteoarthritis left hip.   PROCEDURE: Removal of deep hardware with revision prior surgery to total hip arthroplasty.   ANESTHESIA: Spinal.   SURGEON: John [name], M.D.   DESCRIPTION OF PROCEDURE: The patient was brought to the operating room and after adequate anesthesia was obtained, the patient was placed on the operative table with the Medacta attachment. The right leg was on a well-padded table. After patient identification and timeout procedures were completed, the proximal incision was made superior to the greater trochanter and the initial flexible screwdriver was used to loosen the lag screw. A lateral incision was made and the guidewire inserted up into the lag screw and the lag screws removed without difficulty. Attention was then turned distally and a single distal interlocking screw was removed through a small stab incision, again without difficulty. Going proximally, it was difficult to get the tip of the rod attached to a rod extraction adapter and so the anterior approach was carried out to the hip centered over the greater trochanter and the tensor fascia lata muscle. The TFL was incised. The muscle retracted laterally. Deep tissue incised and the lateral femoral circumflex vessels ligated. Exposure of the joint was then carried out with anterior capsulotomy. The femoral head was removed after making a step cut above the prior intertrochanteric fracture, which had not healed. The head was removed with some difficulty.  It was in multiple pieces and there had been a prior screw placed that had out superiorly.  The head was removed and following this, the residual neck bone above the intertrochanteric fracture was removed. This allowed for exposure of the rod. The  rod could then be brought up through the anterior wound and removed in its entirety.   Next, the acetabulum was checked and there was abrasion superolaterally where the screw had penetrated the head. The hip was reamed to 52 mm and a 52 mm trial fit well. The 52 mm  Medacta Versafitcup DM was impacted into place. Next, the femur was sequentially broached up to a size 8.  The 8 stem gave good fill and was trialing.  It was chosen as the final component. The AMIS 8 stem was impacted and an M 28 mm liner gave appropriate length with trialing along with the liner for the 52 mm cup. These components were assembled, impacted and the hip reduced. The hip was stable, to a 90 degrees external rotation and intraoperative x-rays showed, what appeared to be, appropriate length of the femur. The wounds were all thoroughly irrigated. The deep wounds were closed using #1 Vicryl for the rod removal,  heavy Quill for the anterior approach to the hip 2-0 Quill subcutaneously and skin staples. Xeroform, 4 x 4's, ABD and tape applied.   ESTIMATED BLOOD LOSS: 650 with 300 reinfused from [employer].   SPECIMEN: The femoral head.   COMPLICATIONS: None.   DRAINS: None.   CONDITION: To recovery room stable.   ____________________________ John SchullerMichael J. Santino Kinsella, MD mjm:ds D: 12/10/2013 20:21:08 ET T: 12/10/2013 21:16:15 ET JOB#: [id number]  cc: John SchullerMichael J. [name], MD, <Dictator> John SchullerMICHAEL J [name] MD ELECTRONICALLY SIGNED 12/10/2013 23:57

## 2014-09-04 NOTE — Discharge Summary (Signed)
PATIENT NAME:  John [name], John [name] MR#:  [id number] DATE OF BIRTH:  1929-11-30  REASON FOR ADMISSION: Status post fall with hip fracture.   DISPOSITION: Skilled nursing facility.  FOLLOWUP: Orthopedics, Dr. Rosita KeaMenz, in 1 to 2 weeks. Primary care physician, Dr. Barry BrunnerGlenn Willett, in the next 1 to 2 weeks.   MEDICATIONS AT DISCHARGE: Flomax 0.4 mg once daily, Xarelto 15 mg once a day in the evening, diltiazem 180 mg once a day, furosemide 20 mg once a day, docusate 100 mg twice daily, Protonix 40 mg once daily, levothyroxine 88 mcg once daily, acetaminophen as needed for pain, Tylenol with hydrocodone 325/5 mg every 4 to 6 hours as needed for pain, milk of magnesia as needed for constipation, Ceftin 500 mg every 8 hours for urinary tract infection for 4 more days, Niferex 150 mg daily, bisacodyl 10 mg as needed suppository, MiraLAX 17 g daily.   FOLLOWUP: Dr. Beckey DowningWillett in 2 weeks, Dr. Rosita KeaMenz in the next 1 to 2 weeks.   Recommendations about dressing care and staple removal and followup with ortho given by orthopedics.   HOSPITAL COURSE: This is a nice 81 year old gentleman with a history of atrial fibrillation, on chronic anticoagulation, hypertension and hip pain. The patient is from [facility] nursing facility and suffered a mechanical fall with significant pain at the level of the left hip. In the left hip, the patient had a significant fracture, comminuted, angulated, intertrochanteric hip fracture for which he underwent an open reduction, internal fixation by Dr. Rosita KeaMenz.   PROBLEMS:  1.  Hip fracture. The patient had open reduction, internal fixation done on May 21 without any significant complications. The patient had chronic anticoagulation and he was given some FFP for that. His anticoagulation is with Xareltothat would not be affected totally by FFP but that was the order of the orthopedic surgeon.  2.  The patient had atrial fibrillation, which has been rate controlled on Cardizem during this  hospitalization. Xarelto has been restarted for DVT prophylaxis and for stroke prevention.  3.  Acute kidney injury. The patient had increased creatinine at the level of 1.62, at discharge is 1.47, which is his baseline.  4.  The patient also had acute blood loss anemia, for which we started him on Niferex. His hemoglobin is around 10. He also had thrombocytopenia, which was secondary to fracture,surgery and recovery proceeded without any significant signs of bleeding.  5.  As far as his BPH, the patient continued to take tamsulosin and he has not had any problems.  6.  Synthroid has been continued for his hypothyroidism.   Overall, the patient did well during this hospitalization, required physical therapy to help with ambulation and rehabilitation.  We discharged this patient on postoperative day #3 without any significant issues.   [name] spent about 45 minutes discharging this patient.     ____________________________ Felipa Furnaceoberto Sanchez Gutierrez, MD rsg:mr D: 10/04/2013 11:36:00 ET T: 10/04/2013 18:49:40 ET JOB#: [id number]  cc: Jorje GuildGlenn R. Beckey DowningWillett, MD Felipa Furnaceoberto Sanchez Gutierrez, MD, <Dictator>      Dejana Pugsley Juanda ChanceSANCHEZ GUTIERRE MD ELECTRONICALLY SIGNED 10/17/2013 11:01

## 2014-09-04 NOTE — H&P (Signed)
PATIENT NAME:  John [name], John [name] MR#:  [id number] DATE OF BIRTH:  1930-04-16  DATE OF ADMISSION:  10/01/2013  REFERRING PHYSICIAN:  Dr. Bayard Malesandolph Brown.   PRIMARY CARE PHYSICIAN:  Dr. Beckey DowningWillett.   CHIEF COMPLAINT:  Hip pain.   HISTORY OF PRESENT ILLNESS:  An 81 year old Caucasian gentleman with a past medical history of atrial fibrillation on coagulation with Xarelto as well as hypertension, presenting with hip pain.  This gentleman resides at [facility].  He suffered a mechanical fall slightly greater than 24 hours ago, had no loss of consciousness or head trauma on the day prior to presentation to the Emergency Department.  He has had no loss of consciousness or head trauma.  After his fall he had acute pain located over the left hip, nonradiating, sharp in quality, 9 to 10 out of 10 in intensity, worse with movement.  No relieving factors.  No further associated symptoms.  No chest pain, palpitations, shortness of breath.  Emergency Department work-up revealed comminuted angulated intertrochanteric fracture of the left hip.  The patient currently denies pain after receiving pain medications.   REVIEW OF SYSTEMS:  CONSTITUTIONAL:  Denies fevers, chills, fatigue.  EYES:  No blurry vision, double vision, eye pain.  EAR, NOSE, THROAT:  Denies tinnitus, ear pain, hearing loss.   RESPIRATORY:  Denies cough, wheeze, shortness of breath.  CARDIOVASCULAR:  Positive for edema which is chronic.  Denies chest pain or palpitations.  GASTROINTESTINAL:  Denies nausea, vomiting, diarrhea, abdominal pain.  GENITOURINARY:  Denies dysuria, hematuria.  ENDOCRINE:  Denies nocturia or thyroid problems.  HEMATOLOGIC AND LYMPHATIC:  Denies easy bruising or bleeding.  SKIN:  Denies rashes or lesions.  MUSCULOSKELETAL:  Positive for pain in the left hip as described above; however denies any neck or back, shoulder or knee pain.  NEUROLOGIC:  Denies any paralysis or paresthesias.  PSYCHIATRIC:  Denies  any anxiety or depressive symptoms.  Otherwise, a full review of systems performed by me is negative.   PAST MEDICAL HISTORY:  Paroxysmal atrial fibrillation, on Xarelto for anticoagulation, hypertension as well as BPH.   SOCIAL HISTORY:  Uses a cane for ambulation.  Currently resides at [facility].  Remote history of tobacco usage.  Denies any alcohol usage.   FAMILY HISTORY:  Positive for coronary artery disease.   ALLERGIES:  No known drug allergies.   HOME MEDICATIONS:  Include aspirin 81 mg by mouth daily, Flomax 0.4 mg by mouth daily, diltiazem 180 mg by mouth daily, Xarelto 50 mg by mouth daily, Lasix 20 mg by mouth daily, Colace 100 mg by mouth twice daily as needed for constipation, pantoprazole 40 mg by mouth daily, levothyroxine 88 mcg by mouth daily.   PHYSICAL EXAMINATION: VITAL SIGNS:  Temperature 97.6, heart rate 61, respirations 18, blood pressure 177/93, saturating 96% on room air.  Weight 83.9 kg, BMI 25.1.  GENERAL:  Well-nourished, well-developed, Caucasian gentleman, currently in no acute distress.  HEAD:  Normocephalic, atraumatic.  EYES:  Pupils equal, round, reactive to light.  Extraocular muscles intact.  No scleral icterus.  MOUTH:  Moist mucosal membrane.  Dentition intact.  No abscess noted.  EARS, NOSE, THROAT:  Throat clear without exudates.  No external lesions.  NECK:  Supple.  No thyromegaly.  No nodules.  No JVD.  PULMONARY:  Clear to auscultation bilaterally without wheezes, rubs or rhonchi.  No use of accessory muscles.  Good respiratory effort.  CHEST:  Nontender to palpation.  CARDIOVASCULAR:  S1, S2, regular rate  and rhythm.  No murmurs, rubs, or gallops.  1+ edema bilaterally to shins.  Pedal pulses 2+ bilaterally.  GASTROINTESTINAL:  Soft, nontender, nondistended.  No masses.  Positive bowel sounds.  No hepatosplenomegaly.  MUSCULOSKELETAL:  No swelling or clubbing.  Positive for edema as described above.  The left lower extremity is shortened and  externally rotated.  Range of motion proximally limited in the left extremity secondary to pain and fracture, however distal range of motion intact.  NEUROLOGIC:  Cranial nerves II through XII intact.  No gross focal neurological deficits.  Sensation intact.  Reflexes intact.  SKIN:  No ulcerations, lesions, rash, cyanosis.  Skin warm, dry.  Turgor intact.  PSYCHIATRIC:  Mood and affect within normal limits.  The patient is awake, alert, and oriented x 3.  Insight and judgment are intact.   LABORATORY DATA:  Chest x-ray performed revealing no acute cardiopulmonary process; however, there appears to be evidence suggesting advanced interstitial lung disease.  X-ray of the left hip reveals mildly communicative angulated intertrochanteric fracture of the left hip.  EKG performed revealing sinus bradycardia, heart rate of 56.  No ST or T wave abnormalities.  Remainder of laboratory data:  Sodium 141, potassium 3.8, chloride 107, bicarb 28, BUN 25, creatinine of 1.62, which is around his baseline from over one year ago, glucose of 110.  WBC of 14, hemoglobin of 14.1, platelets 157.   ASSESSMENT AND PLAN:  An 81 year old gentleman with history paroxysmal atrial fibrillation on Xarelto therapy, presenting with hip pain.  Work-up revealed an intertrochanteric fracture of the left hip.  1.  Preoperative evaluation for open reduction internal fixation of left hip fracture.  It should be considered a moderate risk for moderate risk surgery.  No further testing required prior to surgery.  Prior surgical intervention his METS should be considered less than 4, which is limited only by his mobility.  He has no active congestive heart failure, severe arrhythmias or severe valvular dysfunction.  We will defer pain management as well as venous thromboembolism prophylaxis to surgery.  We will add a bowel regimen with Colace.  As far as medications are concerned we will hold his aspirin and Xarelto.  Ideally for anticoagulation  with Xarelto, should be off for 2 to 3 days; however, risk-benefit ratios in favor of performing surgery as opposed to waiting 2 to 3 days' time.  As orthopedic surgery generally considered a low risk for bleeding with 0% to 2% bleeds.  They can resume his Xarelto 24 hours postoperatively as once again procedure is relatively low risk for bleeding.  As far as her other medications are concerned, would hold his aspirin as well as Lasix and then continue his other medications.  2.  Paroxysmal atrial fibrillation.  We will order off-unit telemetry, anticoagulation as outlined above and continue his home dosages of Cardizem.  3.  Hypothyroidism.  Continue with Synthroid.  4.  Benign prostatic hypertrophy.  Continue Flomax.  5.  Chronic kidney disease with BUN and creatinine essentially at baseline from over a year ago.  6.  Venous thromboembolism prophylaxis with sequential compression devices.  7.  CODE STATUS:  THE PATIENT IS DO NOT RESUSCITATE.   TIME SPENT:  45 minutes.     ____________________________ Cletis Athens. Hower, MD dkh:ea D: 10/01/2013 04:03:06 ET T: 10/01/2013 04:35:58 ET JOB#: [id number]  cc: Cletis Athens. Hower, MD, <Dictator> DAVID Synetta Shadow MD ELECTRONICALLY SIGNED 10/01/2013 20:28

## 2014-09-04 NOTE — Op Note (Signed)
PATIENT NAME:  Charlton HawsVANS, Georg I MR#:  [id number] DATE OF BIRTH:  1929/10/21  DATE OF PROCEDURE:  10/01/2013  PREOPERATIVE DIAGNOSIS: Left intertrochanteric hip fracture.   POSTOPERATIVE DIAGNOSIS: Left intertrochanteric hip fracture.   PROCEDURE: Open reduction and internal fixation, left intertrochanteric hip fracture.   ANESTHESIA: General.   SURGEON: Leitha SchullerMichael J. Rosangelica Pevehouse, M.D.   DESCRIPTION OF PROCEDURE: The patient was brought to the operating room and after adequate anesthesia was obtained, the patient was placed on the fracture table with left leg in the traction boot, right leg in the well leg holder. Traction and internal rotation was applied. There was still posterior sag, and a crutch was used to lift up the posterior aspect of the proximal femur and this gave anatomic reduction. After prepping and draping in the usual sterile fashion, patient identification and timeout procedures were completed. A proximal skin incision was made and the guidewire inserted through the center of the trochanter on AP and lateral projections. Proximal reaming was carried out and the guidewire inserted down the canal. Measurements were made and a 400 x 9 mm left Affixus rod was obtained and inserted down the canal. After  insertion of the rod to the appropriate level, lateral incision was made and a guidewire inserted. When this was placed in an acceptable position, drilling was carried out and 105 mm lag screw was inserted. Traction was released, and compression was applied to the fracture site. The set screw was set proximally and appeared to have stable fixation. A distal interlocking screw was placed using a freehand technique, aligning up the holes, making a small stab incision, drilling and placing a 5.0 cortical screw, 48 mm in length. The wounds were then thoroughly irrigated. The deep fascia repaired using #1 Vicryl, 2-0 Vicryl subcutaneously, followed by skin staples. Xeroform, 4 x 4's, ABD and tape applied. The  patient was sent to the recovery room in stable condition.   ESTIMATED BLOOD LOSS: 200.   COMPLICATIONS: None.   SPECIMEN: None.   IMPLANT: Affixus 9 x 400 mm rod with 105 mm lag screw and distal cortical interlocking screw.   ____________________________ Leitha SchullerMichael J. Shakedra Beam, MD mjm:gb D: 10/01/2013 23:26:56 ET T: 10/02/2013 03:42:21 ET JOB#: [id number]  cc: Leitha SchullerMichael J. Madalina Rosman, MD, <Dictator> Leitha SchullerMICHAEL J Satomi Buda MD ELECTRONICALLY SIGNED 10/02/2013 7:56

## 2014-09-12 NOTE — Op Note (Signed)
PATIENT NAME:  John HawsVANS, [name] MR#:  [id number] DATE OF BIRTH:  1929-06-17  DATE OF PROCEDURE:  06/29/2014  PREOPERATIVE DIAGNOSIS:  Nuclear sclerotic cataract of the left eye.   POSTOPERATIVE DIAGNOSIS:  Nuclear sclerotic cataract of the left eye.   OPERATIVE PROCEDURE:  Cataract extraction by phacoemulsification with implant of intraocular lens to left eye.   SURGEON:  Jerilee FieldWilliam L. Donae Kueker, MD  ANESTHESIA:  1. Managed anesthesia care.  2. Topical tetracaine drops followed by 2% Xylocaine jelly applied in the preoperative holding area.   COMPLICATIONS:  None.   TECHNIQUE:  Stop and chop.  DESCRIPTION OF PROCEDURE:  The patient was examined and consented in the preoperative holding area where the aforementioned topical anesthesia was applied to the left eye and then brought back to the operating room, where the left eye was prepped and draped in the usual sterile ophthalmic fashion and a lid speculum was placed. A paracentesis was created with the side port blade and the anterior chamber was filled with viscoelastic. A near clear corneal incision was performed with the steel keratome. A continuous curvilinear capsulorrhexis was performed with a cystotome followed by the capsulorrhexis forceps. Hydrodissection and hydrodelineation were carried out with BSS on a blunt cannula. The lens was removed in a stop and chop technique and the remaining cortical material was removed with the irrigation-aspiration handpiece. The capsular bag was inflated with viscoelastic and the Tecnis ZCB00, 19.5-diopter lens, serial number [id number], was placed in the capsular bag without complication. The remaining viscoelastic was removed from the eye with the irrigation-aspiration handpiece. The wounds were hydrated. The anterior chamber was flushed with Miostat and the eye was inflated to physiologic pressure. 0.1 mL of cefuroxime concentration 10 mg/mL was placed in the anterior chamber. The wounds were found to be water  tight. The eye was dressed with Vigamox. The patient was given protective glasses to wear throughout the day and a shield with which to sleep tonight. The patient was also given drops with which to begin a drop regimen today and will follow up with me in one day.    ____________________________ Jerilee FieldWilliam L. Hue Steveson, MD wlp:nb D: 06/29/2014 21:35:20 ET T: 06/30/2014 06:31:31 ET JOB#: [id number]  cc: Jenine Krisher L. Fallou Hulbert, MD, <Dictator> Jerilee FieldWILLIAM L Kerianna Rawlinson MD ELECTRONICALLY SIGNED 07/08/2014 10:30

## 2015-09-11 IMAGING — CT CT HEAD WITHOUT CONTRAST
2 of 3 series · 16 of 30 positions shown, 19 images · non-contrast
Comparison: None

CLINICAL DATA: Altered mental status after left hip hardware repair

EXAM:
CT HEAD WITHOUT CONTRAST
TECHNIQUE: Contiguous axial images were obtained from the base of the skull
through the vertex without intravenous contrast.

[Series 2: head wo · axial · 0.43mm/px · z∈[-160,-40]mm · 9 of 31 slices shown, 12 images]
[im 4/31  brain]
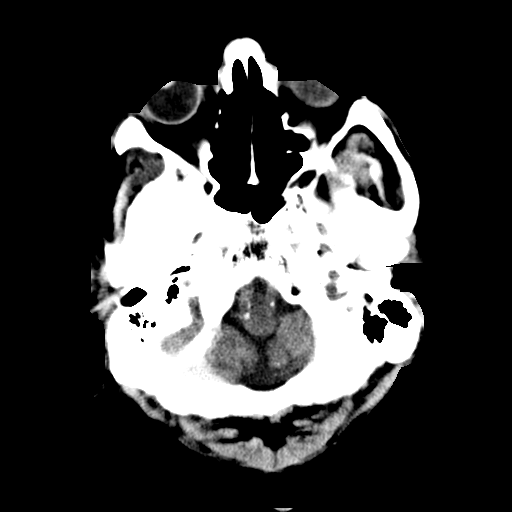
[im 4/31  bone]
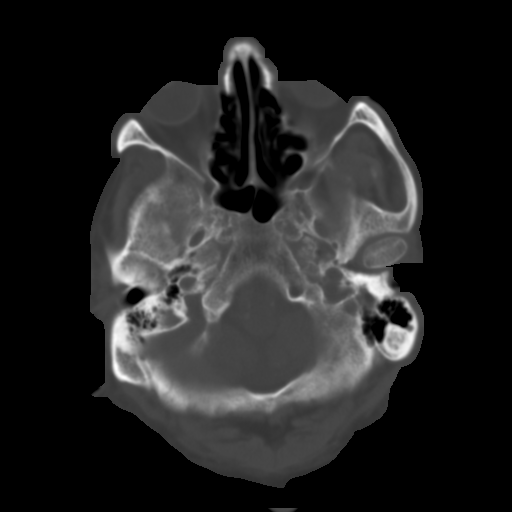
[im 7/31  brain]
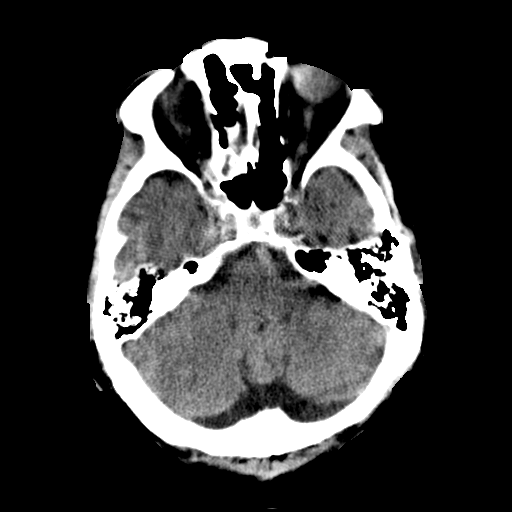
[im 10/31  brain]
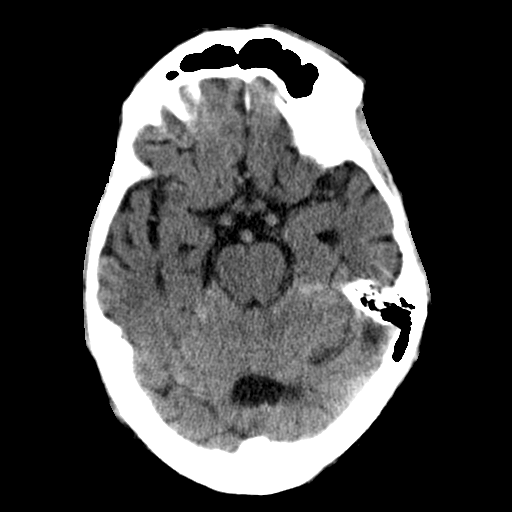
[im 13/31  brain]
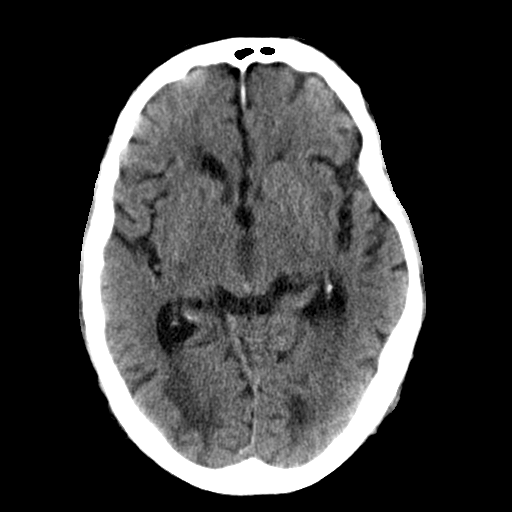
[im 16/31  brain]
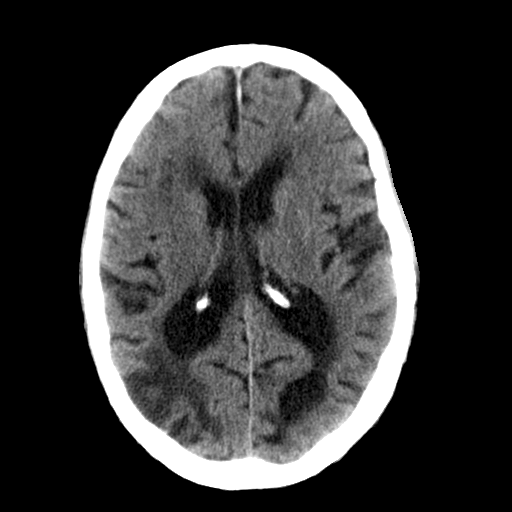
[im 16/31  bone]
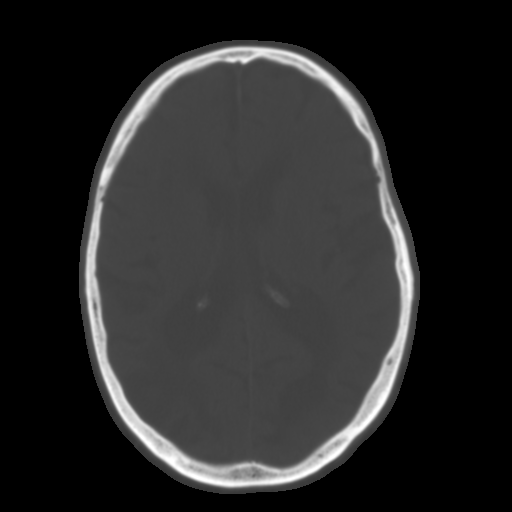
[im 19/31  brain]
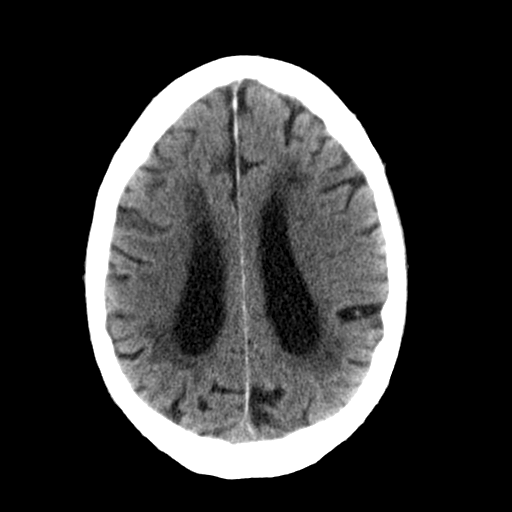
[im 22/31  brain]
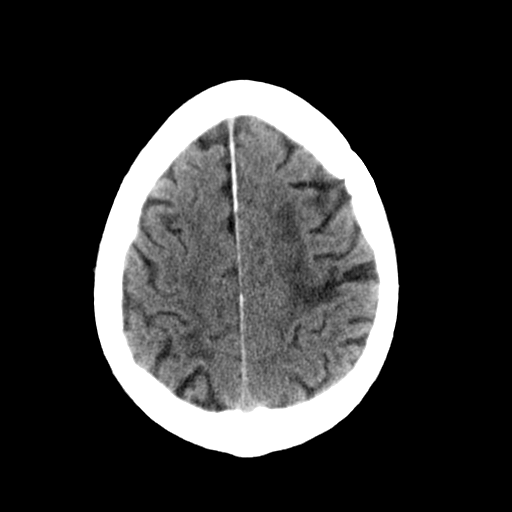
[im 25/31  brain]
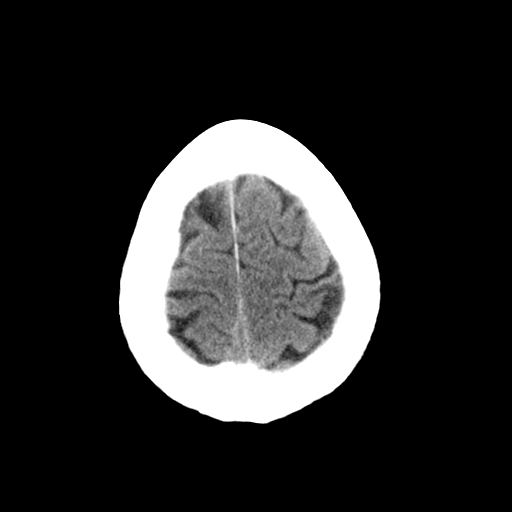
[im 28/31  brain]
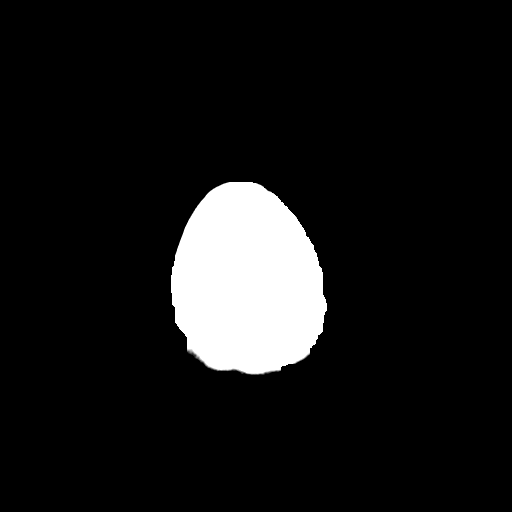
[im 28/31  bone]
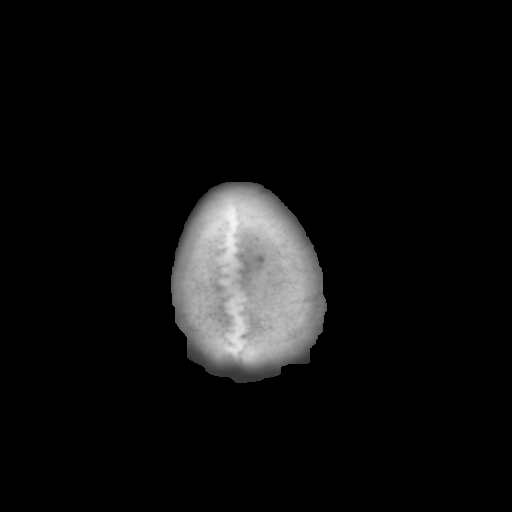

[Series 4: head wo recon · axial · 0.43mm/px · z∈[-126,-39]mm · 7 of 28 slices shown]
[im 4/28  brain]
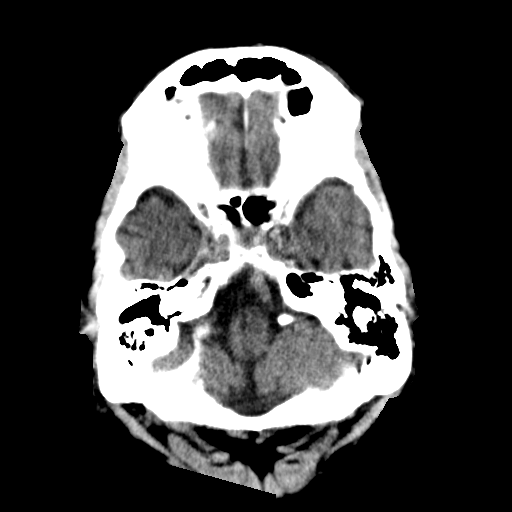
[im 7/28  brain]
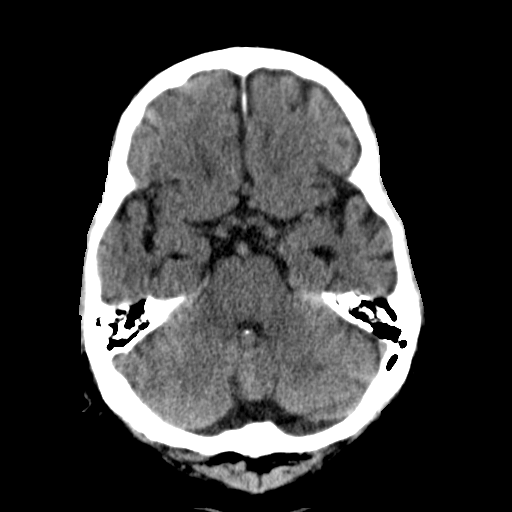
[im 10/28  brain]
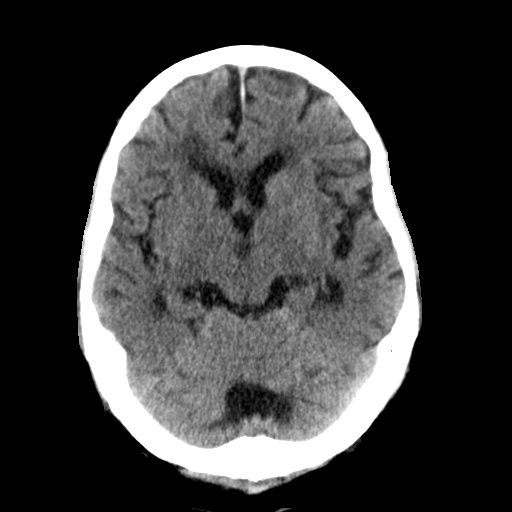
[im 13/28  brain]
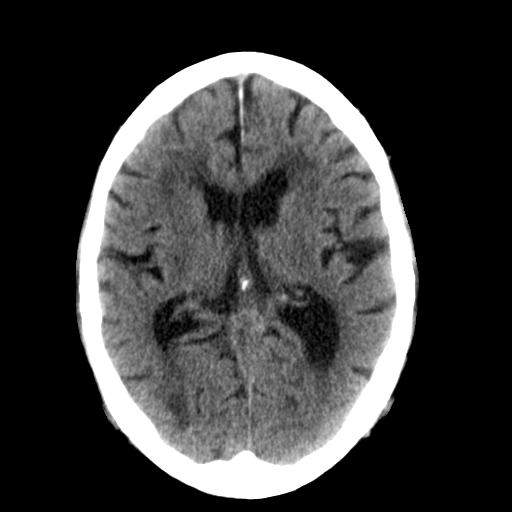
[im 16/28  brain]
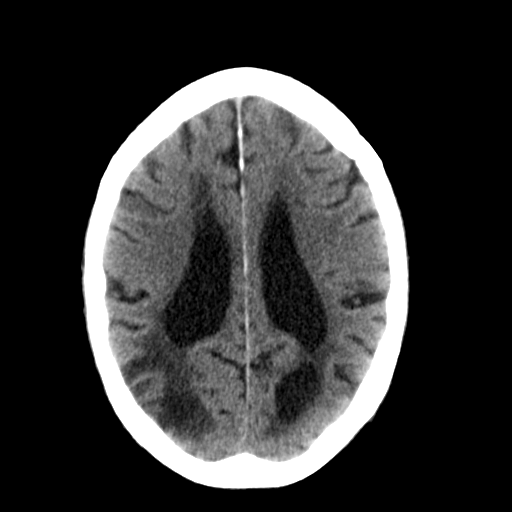
[im 19/28  brain]
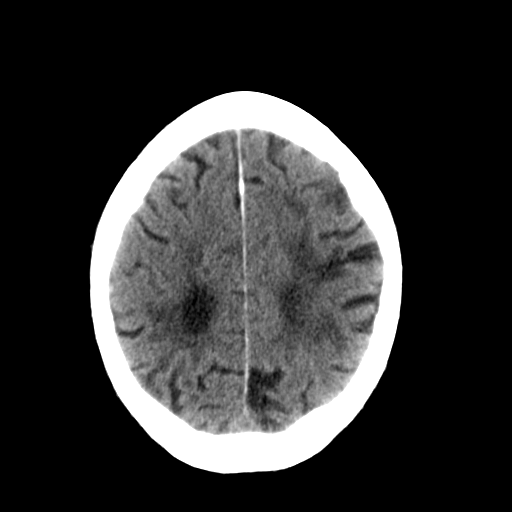
[im 22/28  brain]
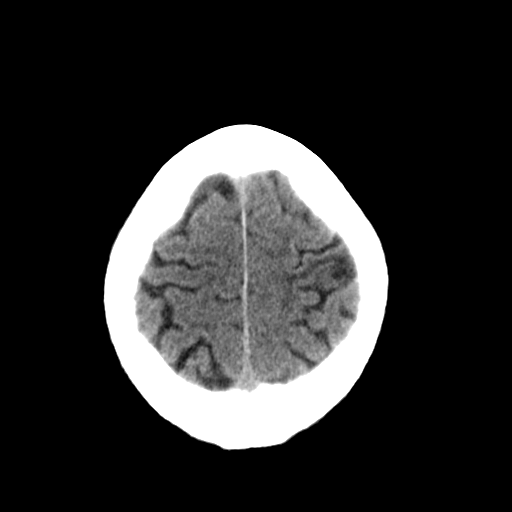

[16 of 30 positions shown; findings below may reference images not displayed]

FINDINGS: Fairly extensive bilateral, multi focal white matter abnormality is
identified. There is an area of encephalomalacia within the right
posterior parietal lobe and occipital lobe which is new from 7882
compatible with subacute to chronic cortical infarct. Stable
encephalomalacia involving the medial aspect of the left occipital
lobe.

Prominence of the sulci and ventricles noted consistent with brain
atrophy. There is no evidence for acute brain infarct, hemorrhage or
mass. No midline shift or mass effect identified. The mastoid air
cells are clear. There is mucosal thickening involving the right
maxillary sinus. The calvarium appears intact.
IMPRESSION: 1. Fairly extensive bilateral white matter abnormality which likely
reflects chronic small vessel ischemic disease.
2. Subacute to chronic right posterior parietal cortical infarct is
new from [DATE].
3. Stable left occipital encephalomalacia.

## 2016-09-11 DEATH — deceased
